# Patient Record
Sex: Male | Born: 1980 | Race: Black or African American | Hispanic: No | Marital: Single | Smoking: Never smoker
Health system: Southern US, Community
[De-identification: ages and names within clinical notes are randomized; demographics above are authoritative.]

---
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
## 2002-06-24 ENCOUNTER — Emergency Department (HOSPITAL_COMMUNITY): Admission: EM | Admit: 2002-06-24 | Discharge: 2002-06-24 | Payer: Self-pay | Admitting: Emergency Medicine

## 2002-06-24 ENCOUNTER — Emergency Department (HOSPITAL_COMMUNITY): Admission: EM | Admit: 2002-06-24 | Discharge: 2002-06-24 | Payer: Self-pay | Admitting: *Deleted

## 2007-09-01 ENCOUNTER — Emergency Department (HOSPITAL_COMMUNITY): Admission: EM | Admit: 2007-09-01 | Discharge: 2007-09-01 | Payer: Self-pay | Admitting: Emergency Medicine

## 2011-04-23 NOTE — Discharge Summary (Signed)
[facility]  Patient:    Drew Scott, Drew Scott                    MRN: [id number] Adm. Date:  [id number] Disc. Date: 12/28/00 Attending:  Silvio Pate Dictator:   Earley Favor, RN, MSN, ACNP                           Discharge Summary  DATE OF BIRTH:  December 27, 1980  DISCHARGE DIAGNOSES: 1. Acute respiratory failure. 2. Bilateral lower lobe pneumonia. 3. History of asthma.  PROCEDURES:  None.  HISTORY OF PRESENT ILLNESS:  Drew Scott is an 30 year old African-American male smoker who woke up on December 22, 2000, with severe shortness of breath, subjective fevers, nonpleuritic chest pain, and cough that was productive of white sputum.  Drew Scott noted approximately a 24-hour prodromal history.  Drew Scott was negative for hemoptysis or diarrhea.  Drew Scott does have a history of asthma which required nebulized bronchodilators until approximately one year ago.  Due to his radiographic data that indicative of bilateral lower lobe pneumonia and his hypoxia, Drew Scott was admitted for further evaluation and treatment.  LABORATORY AND X-RAY DATA:  Blood cultures were negative x 5 days.  Sputum culture demonstrated normal flora.  Arterial blood gas on 21% FIO2:  The pH is 7.405, pCO2 of 34, pO2 of 31 with a bicarb of 24.  That was on December 22, 2000.  On December 26, 2000, on room air pH 7.403, pCO2 of 35, pO2 of 81.  WBC is 4.7, hemoglobin 12.3, hematocrit 34.9, platelets are 173.  Sodium 139, potassium 3.5, chloride 104, CO2 of 24, glucose 134, BUN 6, creatinine 0.9, calcium was 8.8.  ALT was 22, ALP was 55, total bilirubin 0.9.  HIV was nonreactive.  Urinalysis was unremarkable.  Grams stain was unremarkable. Pneumocystis carinii smears were negative.  No acid-fast bacteria were noted.  Radiographic data:  Chest x-ray on December 26, 2000, showed improving aeration, resolving infiltrates.  Initial chest x-ray demonstrated bilateral lower lobe densities most  consistent with acute pneumonia.  HOSPITAL COURSE: #1 - ACUTE RESPIRATORY FAILURE:  Drew Scott was admitted initially to the medical intensive care unit at [facility].  Drew Scott was treated with activated protein C, i.e., Xigris for severe sepsis.  Drew Scott was also treated with high-flow oxygen.  Drew Scott narrowly avoided being intubated.  Drew Scott responded well t fluid rehydration and antimicrobial therapy along with bronchodilators.  Drew Scott reached maximal hospital benefit by December 28, 2000, and was discharged home.  #2 - BILATERAL LOWER LOBE PNEUMONIA:  Chest x-ray initially showed bilateral lower lobe pneumonia.  X-ray improved dramatically with the institution of antibiotics along with the activated protein C.  Drew Scott will be continued on Ceftin at 250 mg for five more days to complete 10 days of antimicrobial therapy.  #3 - HISTORY OF ASTHMA:  Drew Scott was initially treated with nebulized bronchodilators.  Drew Scott was switched over to Advair 100/50 on a b.i.d. basis.  DISCHARGE MEDICATIONS: 1. Advair 100/50 one puff b.i.d. 2. Ceftin 250 mg b.i.d. x 5 days.  DISCHARGE FOLLOWUP:  Drew Scott has a follow-up scheduled with Dr. Danice Goltz in one week.  DISCHARGE INSTRUCTIONS:  Diet:  As tolerated.  SPECIAL DISCHARGE INSTRUCTIONS:  Avoid smoking.  DISPOSITION/CONDITION ON DISCHARGE:  Acute respiratory failure has been resolved.  Drew Scott has returned to his normal pulmonary baseline.  Radiographic data demonstrates remarkable improvement in aeration bilateral lungs. DD:  12/28/00 TD:  12/28/00 Job: [id number]

## 2021-09-10 ENCOUNTER — Ambulatory Visit (HOSPITAL_COMMUNITY)
Admission: EM | Admit: 2021-09-10 | Discharge: 2021-09-10 | Disposition: A | Discharge status: Home / Self Care | Payer: Self-pay | Attending: Internal Medicine | Admitting: Internal Medicine

## 2021-09-10 ENCOUNTER — Encounter (HOSPITAL_COMMUNITY): Payer: Self-pay | Admitting: Emergency Medicine

## 2021-09-10 ENCOUNTER — Other Ambulatory Visit: Payer: Self-pay

## 2021-09-10 DIAGNOSIS — J020 Streptococcal pharyngitis: Secondary | ICD-10-CM

## 2021-09-10 LAB — POCT RAPID STREP A, ED / UC: Streptococcus, Group A Screen (Direct): POSITIVE — AB

## 2021-09-10 MED ORDER — METHYLPREDNISOLONE SODIUM SUCC 125 MG IJ SOLR
60.0000 mg | Freq: Once | INTRAMUSCULAR | Status: AC
  Administered 2021-09-10: 60 mg via INTRAMUSCULAR

## 2021-09-10 MED ORDER — LIDOCAINE VISCOUS HCL 2 % MT SOLN: 15.0000 mL | OROMUCOSAL | 0 refills | Status: AC | PRN

## 2021-09-10 MED ORDER — METHYLPREDNISOLONE SODIUM SUCC 125 MG IJ SOLR
INTRAMUSCULAR | Status: AC
  Filled 2021-09-10: qty 2

## 2021-09-10 MED ORDER — IBUPROFEN 800 MG PO TABS: 800.0000 mg | ORAL_TABLET | Freq: Three times a day (TID) | ORAL | 0 refills | Status: AC

## 2021-09-10 MED ORDER — AMOXICILLIN-POT CLAVULANATE 875-125 MG PO TABS: 1.0000 | ORAL_TABLET | Freq: Two times a day (BID) | ORAL | 0 refills | Status: AC

## 2021-09-10 NOTE — ED Triage Notes (Signed)
Pt c/o of sore throat that hurts to swallow x 2 days

## 2021-09-10 NOTE — Discharge Instructions (Addendum)
Take Augmentin twice a day for the next 7 days can start medication tonight  Can use ibuprofen every 8 hours as needed to help with pain and swelling  You have been given a steroid injection here in the office today to help reduce some of the swelling and pain in your throat  You may use lidocaine solution every 4 hours as needed for comfort    You can take Tylenol and/or Ibuprofen as needed for fever reduction and pain relief.   For sore throat: try warm salt water gargles, cepacol lozenges, throat spray, warm tea or water with lemon/honey, popsicles or ice, or OTC cold relief medicine for throat discomfort.  Follow-up with urgent care as needed for persistent or reoccurring symptoms   It is important to stay hydrated: drink plenty of fluids (water, gatorade/powerade/pedialyte, juices, or teas) to keep your throat moisturized and help further relieve irritation/discomfort.

## 2021-09-14 NOTE — ED Provider Notes (Signed)
[facility]    CSN: [id number] Arrival date & time: 09/10/21  1959      History   Chief Complaint Chief Complaint  Patient presents with   Sore Throat    HPI Drew Scott is a 40 y.o. male.   Patient presents with sore throat for 2 days. Painful to swallow. Endorses sensation of throat swelling with sensation of difficulty breathing. Endorses lump on left side of neck and pain radiating into left ear. Has not attempted treatment of symptoms. Denies fever, chills, body aches, URI symptoms. No pertinent medical history.    History reviewed. No pertinent past medical history.  There are no problems to display for this patient.   History reviewed. No pertinent surgical history.     Home Medications    Prior to Admission medications   Medication Sig Start Date End Date Taking? Authorizing Provider  amoxicillin-clavulanate (AUGMENTIN) 875-125 MG tablet Take 1 tablet by mouth every 12 (twelve) hours. 09/10/21  Yes Holliday Sheaffer R, NP  ibuprofen (ADVIL) 800 MG tablet Take 1 tablet (800 mg total) by mouth 3 (three) times daily. 09/10/21  Yes Sitara Cashwell R, NP  lidocaine (XYLOCAINE) 2 % solution Use as directed 15 mLs in the mouth or throat every 4 (four) hours as needed for mouth pain. 09/10/21  Yes Valinda Hoar, NP    Family History History reviewed. No pertinent family history.  Social History Social History   Tobacco Use   Smoking status: Never   Smokeless tobacco: Never  Vaping Use   Vaping Use: Never used  Substance Use Topics   Alcohol use: Not Currently   Drug use: Not Currently    Types: Marijuana     Allergies   Patient has no known allergies.   Review of Systems Review of Systems  Constitutional: Negative.   HENT:  Positive for sore throat. Negative for congestion, dental problem, drooling, ear discharge, ear pain, facial swelling, hearing loss, mouth sores, nosebleeds, postnasal drip, rhinorrhea, sinus pressure, sinus pain,  sneezing, tinnitus, trouble swallowing and voice change.   Respiratory: Negative.    Cardiovascular: Negative.   Skin: Negative.   Neurological: Negative.     Physical Exam Triage Vital Signs ED Triage Vitals  Enc Vitals Group     BP 09/10/21 2008 (!) 148/98     Pulse Rate 09/10/21 2008 (!) 110     Resp --      Temp 09/10/21 2008 (!) 100.7 F (38.2 C)     Temp Source 09/10/21 2008 Oral     SpO2 09/10/21 2008 97 %     Weight --      Height --      Head Circumference --      Peak Flow --      Pain Score 09/10/21 2028 0     Pain Loc --      Pain Edu? --      Excl. in GC? --    No data found.  Updated Vital Signs BP (!) 148/98 (BP Location: Left Arm)   Pulse (!) 110   Temp (!) 100.7 F (38.2 C) (Oral)   SpO2 97%   Visual Acuity Right Eye Distance:   Left Eye Distance:   Bilateral Distance:    Right Eye Near:   Left Eye Near:    Bilateral Near:     Physical Exam Constitutional:      Appearance: He is well-developed.  HENT:     Head: Normocephalic.  Right Ear: Tympanic membrane and ear canal normal.     Left Ear: Tympanic membrane and ear canal normal.     Nose: No congestion or rhinorrhea.     Mouth/Throat:     Mouth: Mucous membranes are moist.     Pharynx: Uvula midline. Posterior oropharyngeal erythema present.     Tonsils: No tonsillar exudate or tonsillar abscesses. 3+ on the right. 3+ on the left.  Eyes:     Conjunctiva/sclera: Conjunctivae normal.     Pupils: Pupils are equal, round, and reactive to light.  Neck:     Thyroid: No thyromegaly.  Pulmonary:     Effort: Pulmonary effort is normal.     Breath sounds: Normal breath sounds.  Musculoskeletal:     Cervical back: Normal range of motion.  Lymphadenopathy:     Cervical: Cervical adenopathy present.  Skin:    General: Skin is warm and dry.  Neurological:     General: No focal deficit present.     Mental Status: He is alert and oriented to person, place, and time.  Psychiatric:         Mood and Affect: Mood normal.        Behavior: Behavior normal.     UC Treatments / Results  Labs (all labs ordered are listed, but only abnormal results are displayed) Labs Reviewed  POCT RAPID STREP A, ED / UC - Abnormal; Notable for the following components:      Result Value   Streptococcus, Group A Screen (Direct) POSITIVE (*)    All other components within normal limits    EKG   Radiology No results found.  Procedures Procedures (including critical care time)  Medications Ordered in UC Medications  methylPREDNISolone sodium succinate (SOLU-MEDROL) 125 mg/2 mL injection 60 mg (60 mg Intramuscular Given 09/10/21 2026)    Initial Impression / Assessment and Plan / UC Course  I have reviewed the triage vital signs and the nursing notes.  Pertinent labs & imaging results that were available during my care of the patient were reviewed by me and considered in my medical decision making (see chart for details).  Strep pharyngitis   Rapid strep positive Methylprednisolone 60 mg IM now, tonsils enlarged causing sensation of difficulty breathing, 02 saturation 97% on room air, strict precautions given for worsening difficulty breathing to go to nearest emergency department  Augmentin 875/125 bid for 7 days Ibuprofen 800 mg tid prn Lidocaine viscous 2% 15 mL every 4 hours prn  Final Clinical Impressions(s) / UC Diagnoses   Final diagnoses:  Strep pharyngitis     Discharge Instructions      Take Augmentin twice a day for the next 7 days can start medication tonight  Can use ibuprofen every 8 hours as needed to help with pain and swelling  You have been given a steroid injection here in the office today to help reduce some of the swelling and pain in your throat  You may use lidocaine solution every 4 hours as needed for comfort    You can take Tylenol and/or Ibuprofen as needed for fever reduction and pain relief.   For sore throat: try warm salt water gargles,  cepacol lozenges, throat spray, warm tea or water with lemon/honey, popsicles or ice, or OTC cold relief medicine for throat discomfort.  Follow-up with urgent care as needed for persistent or reoccurring symptoms   It is important to stay hydrated: drink plenty of fluids (water, gatorade/powerade/pedialyte, juices, or teas) to keep your throat  moisturized and help further relieve irritation/discomfort.     ED Prescriptions     Medication Sig Dispense Auth. Provider   amoxicillin-clavulanate (AUGMENTIN) 875-125 MG tablet Take 1 tablet by mouth every 12 (twelve) hours. 14 tablet Javari Bufkin R, NP   lidocaine (XYLOCAINE) 2 % solution Use as directed 15 mLs in the mouth or throat every 4 (four) hours as needed for mouth pain. 100 mL Georganne Siple R, NP   ibuprofen (ADVIL) 800 MG tablet Take 1 tablet (800 mg total) by mouth 3 (three) times daily. 21 tablet Shakhia Gramajo, Elita Boone, NP      PDMP not reviewed this encounter.   Valinda Hoar, NP 09/14/21 1113

## 2021-12-09 ENCOUNTER — Other Ambulatory Visit: Payer: Self-pay

## 2021-12-09 ENCOUNTER — Ambulatory Visit (HOSPITAL_COMMUNITY)
Admission: EM | Admit: 2021-12-09 | Discharge: 2021-12-09 | Disposition: A | Discharge status: Home / Self Care | Payer: Self-pay | Attending: Family Medicine | Admitting: Family Medicine

## 2021-12-09 ENCOUNTER — Encounter (HOSPITAL_COMMUNITY): Payer: Self-pay

## 2021-12-09 ENCOUNTER — Ambulatory Visit (INDEPENDENT_AMBULATORY_CARE_PROVIDER_SITE_OTHER): Payer: Self-pay

## 2021-12-09 DIAGNOSIS — K529 Noninfective gastroenteritis and colitis, unspecified: Secondary | ICD-10-CM

## 2021-12-09 DIAGNOSIS — R109 Unspecified abdominal pain: Secondary | ICD-10-CM

## 2021-12-09 DIAGNOSIS — R1084 Generalized abdominal pain: Secondary | ICD-10-CM

## 2021-12-09 DIAGNOSIS — R112 Nausea with vomiting, unspecified: Secondary | ICD-10-CM

## 2021-12-09 MED ORDER — ONDANSETRON 4 MG PO TBDP: 4.0000 mg | ORAL_TABLET | Freq: Three times a day (TID) | ORAL | 0 refills | Status: AC | PRN

## 2021-12-09 NOTE — ED Triage Notes (Signed)
Pt presents to urgent care for sharp  abdominal pains x x 3-4 days. He reports some nausea and one loose stool.

## 2021-12-09 NOTE — ED Notes (Signed)
No answer in lobby.

## 2021-12-09 NOTE — Discharge Instructions (Signed)

## 2021-12-10 NOTE — ED Provider Notes (Signed)
[place]   NV:[id number] 12/09/21 Arrival Time: [place] PLAN:  1. Generalized abdominal pain   2. Enteritis   3. Nausea and vomiting, unspecified vomiting type    I have personally viewed the imaging studies ordered this visit. Without signs of SBO.  Tolerating PO intake. Benign exam. Comfortable with home observation. Ensure hydration. Meds ordered this encounter  Medications   ondansetron (ZOFRAN-ODT) 4 MG disintegrating tablet    Sig: Take 1 tablet (4 mg total) by mouth every 8 (eight) hours as needed for nausea or vomiting.    Dispense:  15 tablet    Refill:  0     Discharge Instructions      You have been seen today for abdominal pain. Your evaluation was not suggestive of any emergent condition requiring medical intervention at this time. However, some abdominal problems make take more time to appear. Therefore, it is very important for you to pay attention to any new symptoms or worsening of your current condition.  Please return here or to the Emergency Department immediately should you begin to feel worse in any way or have any of the following symptoms: increasing or different abdominal pain, persistent vomiting, inability to drink fluids, fevers, or shaking chills.       Follow-up Information     [place].   Specialty: Emergency Medicine Why: If symptoms worsen in any way. Contact information: [address] [id number] [place] [phone]               Reviewed expectations re: course of current medical issues. Questions answered. Outlined signs and symptoms indicating need for more acute intervention. Patient verbalized understanding. After Visit Summary given.  SUBJECTIVE: History from: patient. Drew Scott is a 41 y.o. male who presents with complaint of sharp generalized abd pains; intermittent over past 3-4 days. Today with n/v x 1 and  loose stool  x 1; non-bloody. Ambulatory. Afebrile. Tolerating PO intake currently. No back pain. Normal urination. Passing gas from rectum "but not as much today."  History reviewed. No pertinent surgical history.   OBJECTIVE:  Vitals:   12/09/21 1910 12/09/21 1913  BP:  (!) 142/99  Pulse:  72  Resp:  16  Temp:  98.2 F (36.8 C)  TempSrc: Oral   SpO2: 100% 100%    General appearance: alert, oriented, no acute distress HEENT: [place]; AT; oropharynx moist Lungs: unlabored respirations Abdomen: soft; without distention; mild  and poorly localized tenderness to palpation over epigastric area ; normal bowel sounds; without masses or organomegaly; without guarding or rebound tenderness Back: without reported CVA tenderness; FROM at waist Extremities: without LE edema; symmetrical; without gross deformities Skin: warm and dry Neurologic: normal gait Psychological: alert and cooperative; normal mood and affect  Imaging: DG Abd 2 Views  Result Date: 12/09/2021 CLINICAL DATA:  Pain.  Rule out small bowel obstruction. EXAM: ABDOMEN - 2 VIEW COMPARISON:  None. FINDINGS: Supine and upright views of the abdomen obtained. No free intra-abdominal air. No bowel dilatation to suggest obstruction. There are scattered air-fluid levels throughout nondilated small bowel and colon. No abnormal gastric distension. No visualized radiopaque calculi. Left pelvic phleboliths. No concerning intraabdominal mass effect. No acute findings in the lung bases. No acute osseous abnormalities are seen. Non fusion posterior elements of S1, typically incidental. IMPRESSION: No evidence of bowel obstruction. Scattered air-fluid levels throughout nondilated small bowel and colon, suggesting enteritis. Electronically Signed   By: Threasa Beards  [place] M.D.   On: 12/09/2021 19:36     No Known Allergies                                             History reviewed. No pertinent past medical history.  Social History   Socioeconomic  History   Marital status: Single    Spouse name: Not on file   Number of children: Not on file   Years of education: Not on file   Highest education level: Not on file  Occupational History   Not on file  Tobacco Use   Smoking status: Never   Smokeless tobacco: Never  Vaping Use   Vaping Use: Never used  Substance and Sexual Activity   Alcohol use: Not Currently   Drug use: Not Currently    Types: Marijuana   Sexual activity: Not on file  Other Topics Concern   Not on file  Social History Narrative   Not on file   Social Determinants of Health   Financial Resource Strain: Not on file  Food Insecurity: Not on file  Transportation Needs: Not on file  Physical Activity: Not on file  Stress: Not on file  Social Connections: Not on file  Intimate Partner Violence: Not on file    History reviewed. No pertinent family history.   Vanessa Kick, MD 12/10/21 [phone]

## 2022-09-11 IMAGING — DX DG ABDOMEN 2V
2 series · 2 of 2 positions shown · non-contrast
Comparison: None.

CLINICAL DATA: Pain.  Rule out small bowel obstruction.

EXAM:
ABDOMEN - 2 VIEW

[abdomen supine]
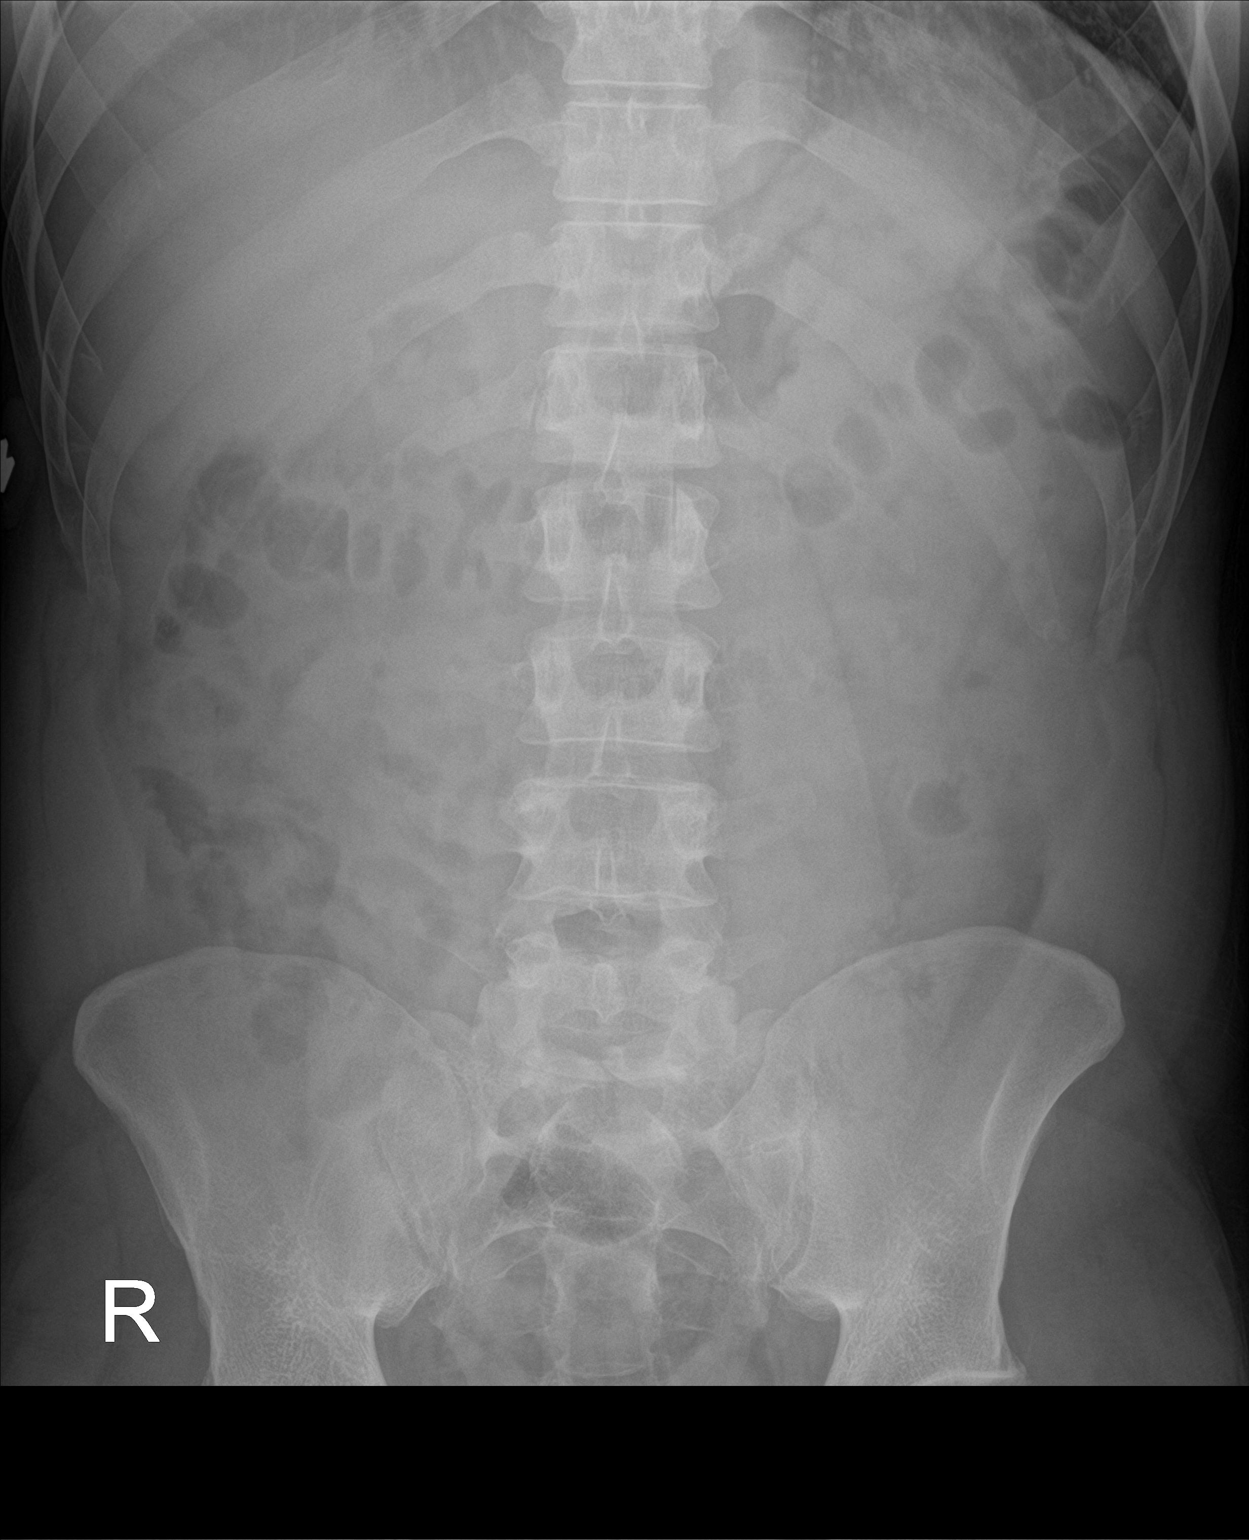

[abdomen erect]
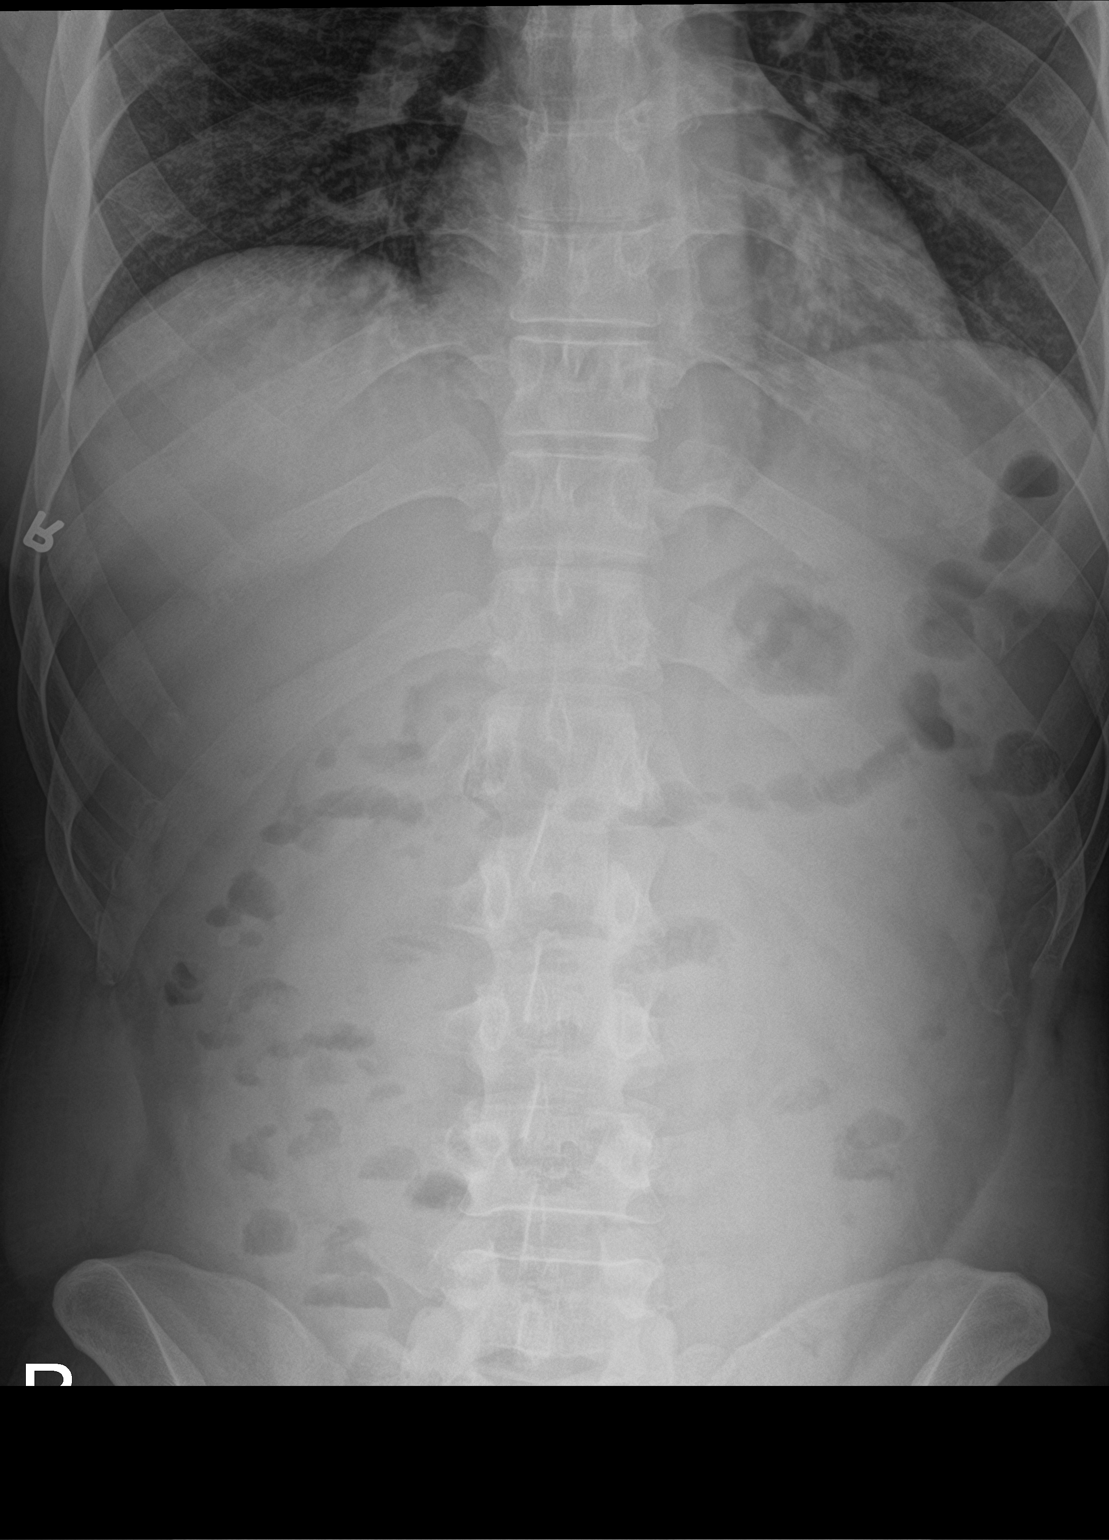

[2 of 2 positions shown; findings below may reference images not displayed]

FINDINGS: Supine and upright views of the abdomen obtained. No free
intra-abdominal air. No bowel dilatation to suggest obstruction.
There are scattered air-fluid levels throughout nondilated small
bowel and colon. No abnormal gastric distension. No visualized
radiopaque calculi. Left pelvic phleboliths. No concerning
intraabdominal mass effect. No acute findings in the lung bases. No
acute osseous abnormalities are seen. Non fusion posterior elements
of S1, typically incidental.
IMPRESSION: No evidence of bowel obstruction. Scattered air-fluid levels
throughout nondilated small bowel and colon, suggesting enteritis.
